# Patient Record
Sex: Male | Born: 1960 | Race: Black or African American | Hispanic: No | Marital: Married | Smoking: Never smoker
Health system: Southern US, Community
[De-identification: ages and names within clinical notes are randomized; demographics above are authoritative.]

---

## 2016-01-08 ENCOUNTER — Emergency Department (HOSPITAL_BASED_OUTPATIENT_CLINIC_OR_DEPARTMENT_OTHER)
Admission: EM | Admit: 2016-01-08 | Discharge: 2016-01-08 | Disposition: A | Discharge status: Home / Self Care | Payer: BLUE CROSS/BLUE SHIELD | Attending: Emergency Medicine | Admitting: Emergency Medicine

## 2016-01-08 ENCOUNTER — Encounter (HOSPITAL_BASED_OUTPATIENT_CLINIC_OR_DEPARTMENT_OTHER): Payer: Self-pay | Admitting: *Deleted

## 2016-01-08 DIAGNOSIS — R1013 Epigastric pain: Secondary | ICD-10-CM | POA: Insufficient documentation

## 2016-01-08 LAB — CBC WITH DIFFERENTIAL/PLATELET
BASOS ABS: 0 10*3/uL (ref 0.0–0.1)
Basophils Relative: 0 %
Eosinophils Absolute: 0.1 10*3/uL (ref 0.0–0.7)
Eosinophils Relative: 1 %
HEMATOCRIT: 44.4 % (ref 39.0–52.0)
HEMOGLOBIN: 15.2 g/dL (ref 13.0–17.0)
LYMPHS PCT: 33 %
Lymphs Abs: 2 10*3/uL (ref 0.7–4.0)
MCH: 32.4 pg (ref 26.0–34.0)
MCHC: 34.2 g/dL (ref 30.0–36.0)
MCV: 94.7 fL (ref 78.0–100.0)
Monocytes Absolute: 0.4 10*3/uL (ref 0.1–1.0)
Monocytes Relative: 7 %
NEUTROS ABS: 3.5 10*3/uL (ref 1.7–7.7)
NEUTROS PCT: 59 %
Platelets: 149 10*3/uL — ABNORMAL LOW (ref 150–400)
RBC: 4.69 MIL/uL (ref 4.22–5.81)
RDW: 13.9 % (ref 11.5–15.5)
WBC: 6 10*3/uL (ref 4.0–10.5)

## 2016-01-08 LAB — COMPREHENSIVE METABOLIC PANEL
ALBUMIN: 4 g/dL (ref 3.5–5.0)
ALT: 17 U/L (ref 17–63)
ANION GAP: 9 (ref 5–15)
AST: 21 U/L (ref 15–41)
Alkaline Phosphatase: 76 U/L (ref 38–126)
BILIRUBIN TOTAL: 0.5 mg/dL (ref 0.3–1.2)
BUN: 17 mg/dL (ref 6–20)
CO2: 27 mmol/L (ref 22–32)
Calcium: 8.8 mg/dL — ABNORMAL LOW (ref 8.9–10.3)
Chloride: 103 mmol/L (ref 101–111)
Creatinine, Ser: 1.29 mg/dL — ABNORMAL HIGH (ref 0.61–1.24)
GFR calc Af Amer: >60 mL/min (ref >60)
GFR calc non Af Amer: >60 mL/min (ref >60)
GLUCOSE: 141 mg/dL — AB (ref 65–99)
POTASSIUM: 3.8 mmol/L (ref 3.5–5.1)
Sodium: 139 mmol/L (ref 135–145)
TOTAL PROTEIN: 7.4 g/dL (ref 6.5–8.1)

## 2016-01-08 LAB — LIPASE, BLOOD: Lipase: 19 U/L (ref 11–51)

## 2016-01-08 MED ORDER — SUCRALFATE 1 G PO TABS: 1.0000 g | ORAL_TABLET | Freq: Three times a day (TID) | ORAL | 0 refills | Status: AC

## 2016-01-08 MED ORDER — OMEPRAZOLE 40 MG PO CPDR: DELAYED_RELEASE_CAPSULE | ORAL | 0 refills | Status: AC

## 2016-01-08 MED ORDER — ONDANSETRON HCL 4 MG/2ML IJ SOLN
4.0000 mg | Freq: Once | INTRAMUSCULAR | Status: AC
  Administered 2016-01-08: 4 mg via INTRAVENOUS
  Filled 2016-01-08: qty 2

## 2016-01-08 MED ORDER — PANTOPRAZOLE SODIUM 40 MG IV SOLR
40.0000 mg | Freq: Once | INTRAVENOUS | Status: AC
  Administered 2016-01-08: 40 mg via INTRAVENOUS
  Filled 2016-01-08: qty 40

## 2016-01-08 MED ORDER — SUCRALFATE 1 G PO TABS
1.0000 g | ORAL_TABLET | Freq: Once | ORAL | Status: AC
  Administered 2016-01-08: 1 g via ORAL
  Filled 2016-01-08: qty 1

## 2016-01-08 MED ORDER — SODIUM CHLORIDE 0.9 % IV SOLN
Freq: Once | INTRAVENOUS | Status: AC
  Administered 2016-01-08: 06:00:00 via INTRAVENOUS

## 2016-01-08 MED ORDER — ONDANSETRON 8 MG PO TBDP: 8.0000 mg | ORAL_TABLET | Freq: Three times a day (TID) | ORAL | 0 refills | Status: AC | PRN

## 2016-01-08 NOTE — ED Provider Notes (Signed)
[facility] Provider Note: Tony DellJ. Lane Kerrigan Gombos, MD, FACEP  CSN: [id number] MRN: [id number] ARRIVAL: 01/08/16 at 0515 ROOM: [facility]   CHIEF COMPLAINT  Abdominal Pain   HISTORY OF PRESENT ILLNESS  Tony Herrera is a 55 y.o. male with epigastric pain since yesterday. He states the pain feels like a big gas bubble. He rates as an 8 out of 10. The pain is worse with movement or palpation. He has had similar pains in the past which usually resolve with simethicone. He took simethicone without relief on this occasion. He has a subjective sense of his abdomen being bloated. He denies fever, chills, nausea, vomiting or diarrhea. He has not moved his bowels in about 3 days which is not unusual for him.   History reviewed. No pertinent past medical history.  History reviewed. No pertinent surgical history.  History reviewed. No pertinent family history.  Social History  Substance Use Topics  . Smoking status: Never Smoker  . Smokeless tobacco: Never Used  . Alcohol use No    Prior to Admission medications   Medication Sig Start Date End Date Taking? Authorizing Provider  omeprazole (PRILOSEC) 40 MG capsule Take one capsule daily at least 30 minutes before first dose of Carafate. 01/08/16   Deysy Schabel, MD  ondansetron (ZOFRAN ODT) 8 MG disintegrating tablet Take 1 tablet (8 mg total) by mouth every 8 (eight) hours as needed for nausea or vomiting. 01/08/16   Jimel Myler, MD  sucralfate (CARAFATE) 1 g tablet Take 1 tablet (1 g total) by mouth 4 (four) times daily -  with meals and at bedtime. 01/08/16   Paula LibraJohn Tayra Dawe, MD    Allergies Patient has no allergy information on record.   REVIEW OF SYSTEMS  Negative except as noted here or in the History of Present Illness.   PHYSICAL EXAMINATION  Initial Vital Signs Blood pressure 135/87, pulse 74, temperature 97.8 F (36.6 C), temperature source Oral, resp. rate 20, height 5\' 7"  (1.702 m), weight 245 lb (111.1 kg), SpO2 98  %.  Examination General: Well-developed, well-nourished male in no acute distress; appearance consistent with age of record HENT: normocephalic; atraumatic Eyes: pupils equal, round and reactive to light; extraocular muscles intact Neck: supple Heart: regular rate and rhythm Lungs: clear to auscultation bilaterally Abdomen: soft; nondistended; epigastric tenderness; negative Murphy's sign; no masses or hepatosplenomegaly; bowel sounds present Extremities: No deformity; full range of motion; pulses normal Neurologic: Awake, alert and oriented; motor function intact in all extremities and symmetric; no facial droop Skin: Warm and dry Psychiatric: Normal mood and affect   RESULTS  Summary of this visit's results, reviewed by myself:   EKG Interpretation  Date/Time:    Ventricular Rate:    PR Interval:    QRS Duration:   QT Interval:    QTC Calculation:   R Axis:     Text Interpretation:        Laboratory Studies: Results for orders placed or performed during the hospital encounter of 01/08/16 (from the past 24 hour(s))  Lipase, blood     Status: None   Collection Time: 01/08/16  5:44 AM  Result Value Ref Range   Lipase 19 11 - 51 U/L  Comprehensive metabolic panel     Status: Abnormal   Collection Time: 01/08/16  5:44 AM  Result Value Ref Range   Sodium 139 135 - 145 mmol/L   Potassium 3.8 3.5 - 5.1 mmol/L   Chloride 103 101 - 111 mmol/L   CO2 27 22 -  32 mmol/L   Glucose, Bld 141 (H) 65 - 99 mg/dL   BUN 17 6 - 20 mg/dL   Creatinine, Ser 9.601.29 (H) 0.61 - 1.24 mg/dL   Calcium 8.8 (L) 8.9 - 10.3 mg/dL   Total Protein 7.4 6.5 - 8.1 g/dL   Albumin 4.0 3.5 - 5.0 g/dL   AST 21 15 - 41 U/L   ALT 17 17 - 63 U/L   Alkaline Phosphatase 76 38 - 126 U/L   Total Bilirubin 0.5 0.3 - 1.2 mg/dL   GFR calc non Af Amer >60 >60 mL/min   GFR calc Af Amer >60 >60 mL/min   Anion gap 9 5 - 15  CBC with Differential/Platelet     Status: Abnormal   Collection Time: 01/08/16  5:44 AM    Result Value Ref Range   WBC 6.0 4.0 - 10.5 K/uL   RBC 4.69 4.22 - 5.81 MIL/uL   Hemoglobin 15.2 13.0 - 17.0 g/dL   HCT 45.444.4 09.839.0 - 11.952.0 %   MCV 94.7 78.0 - 100.0 fL   MCH 32.4 26.0 - 34.0 pg   MCHC 34.2 30.0 - 36.0 g/dL   RDW 14.713.9 82.911.5 - 56.215.5 %   Platelets 149 (L) 150 - 400 K/uL   Neutrophils Relative % 59 %   Neutro Abs 3.5 1.7 - 7.7 K/uL   Lymphocytes Relative 33 %   Lymphs Abs 2.0 0.7 - 4.0 K/uL   Monocytes Relative 7 %   Monocytes Absolute 0.4 0.1 - 1.0 K/uL   Eosinophils Relative 1 %   Eosinophils Absolute 0.1 0.0 - 0.7 K/uL   Basophils Relative 0 %   Basophils Absolute 0.0 0.0 - 0.1 K/uL   Imaging Studies: No results found.  ED COURSE  Nursing notes and initial vitals signs, including pulse oximetry, reviewed.  Vitals:   01/08/16 0527 01/08/16 0528  BP: 135/87   Pulse: 74   Resp: 20   Temp: 97.8 F (36.6 C)   TempSrc: Oral   SpO2: 98%   Weight:  245 lb (111.1 kg)  Height:  5\' 7"  (1.702 m)   6:41 AM Significant relief with IV tonics and oral Carafate. We'll treat for gastritis.  PROCEDURES    ED DIAGNOSES     ICD-9-CM ICD-10-CM   1. Epigastric pain 789.06 R10.13        Paula LibraJohn Marjan Rosman, MD 01/08/16 [phone]

## 2016-01-08 NOTE — ED Triage Notes (Signed)
Pt with upper abd pain and nausea x 12 hours denies V/D

## 2016-01-21 ENCOUNTER — Other Ambulatory Visit: Payer: Self-pay | Admitting: Gastroenterology

## 2016-01-21 DIAGNOSIS — K2970 Gastritis, unspecified, without bleeding: Secondary | ICD-10-CM

## 2016-01-21 DIAGNOSIS — R1013 Epigastric pain: Secondary | ICD-10-CM

## 2016-01-28 ENCOUNTER — Other Ambulatory Visit: Payer: BLUE CROSS/BLUE SHIELD

## 2016-02-04 ENCOUNTER — Ambulatory Visit
Admission: RE | Admit: 2016-02-04 | Discharge: 2016-02-04 | Disposition: A | Discharge status: Home / Self Care | Payer: BLUE CROSS/BLUE SHIELD | Source: Ambulatory Visit | Attending: Gastroenterology | Admitting: Gastroenterology

## 2016-02-04 DIAGNOSIS — K2970 Gastritis, unspecified, without bleeding: Secondary | ICD-10-CM

## 2016-02-04 DIAGNOSIS — R1013 Epigastric pain: Secondary | ICD-10-CM

## 2017-08-16 IMAGING — RF DG UGI W/ HIGH DENSITY W/KUB
6 series · 15 of 22 positions shown · non-contrast
Comparison: None.

CLINICAL DATA: Epigastric pain, gastritis

EXAM:
UPPER GI SERIES WITH KUB
TECHNIQUE: After obtaining a scout radiograph a routine upper GI series was
performed using thin and high density barium.
FLUOROSCOPY TIME:  Fluoroscopy Time:  1 minutes 36 seconds
Radiation Exposure Index (if provided by the fluoroscopic device):
585 mGy
Number of Acquired Spot Images: 9

[Series 1: one shot · 1 of 1 slices shown (1 of 2)]
[im 1/1]
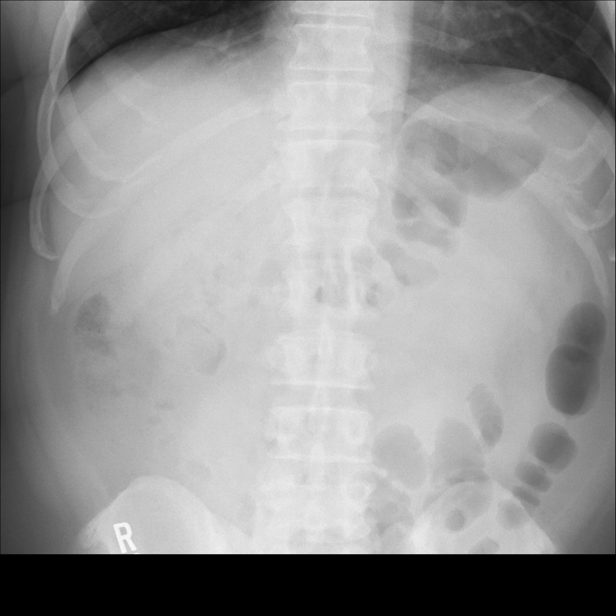

[Series 2: sequence · 2 of 23 frames shown (1 of 4)]
[frame 12/23]
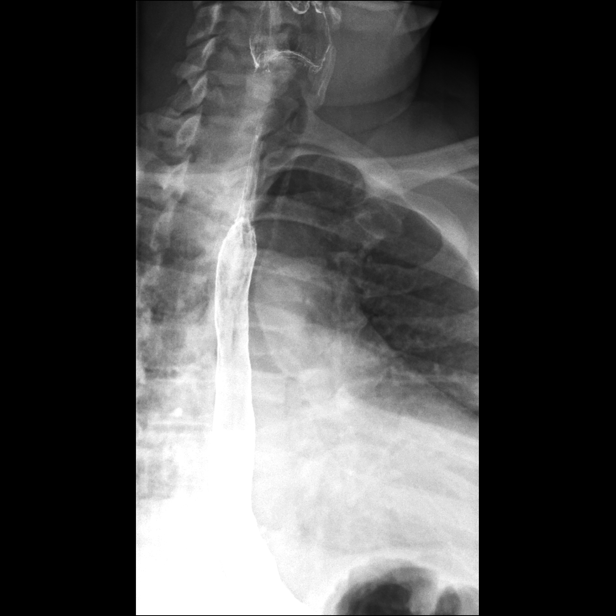
[frame 15/23]
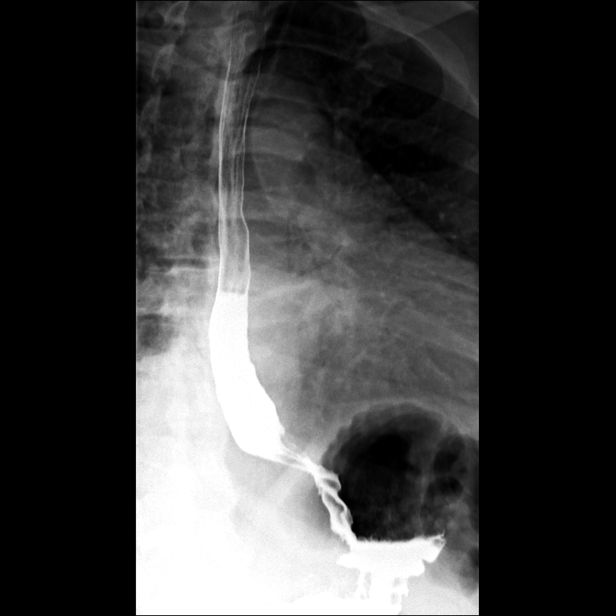

[Series 3: sequence · 3 of 15 frames shown (2 of 4)]
[frame 3/15]
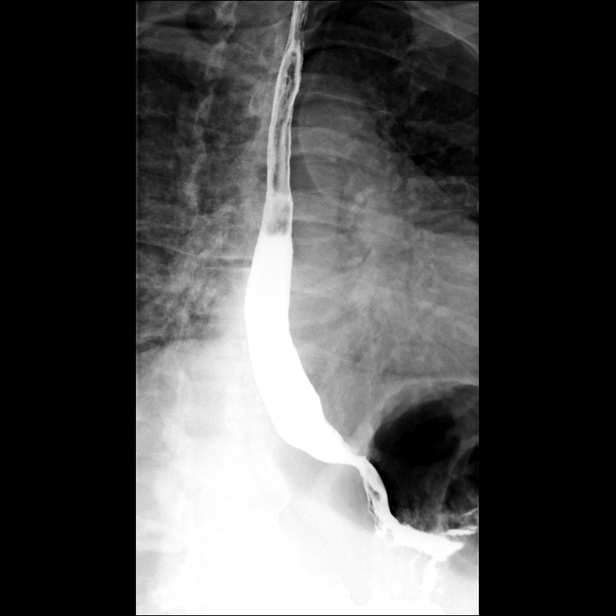
[frame 7/15]
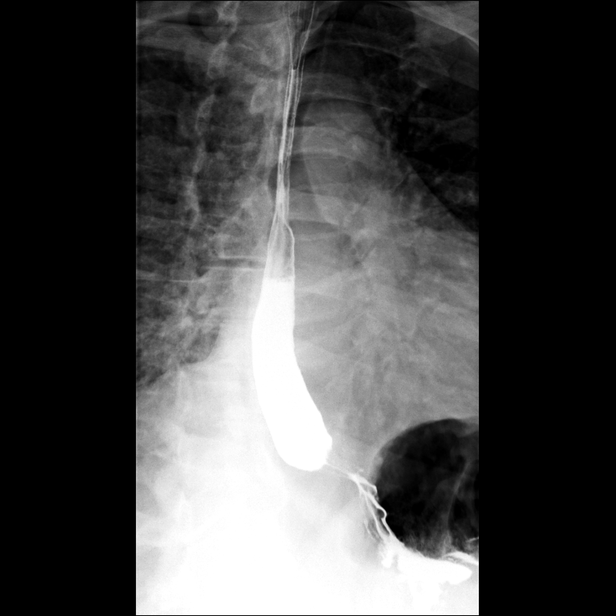
[frame 13/15]
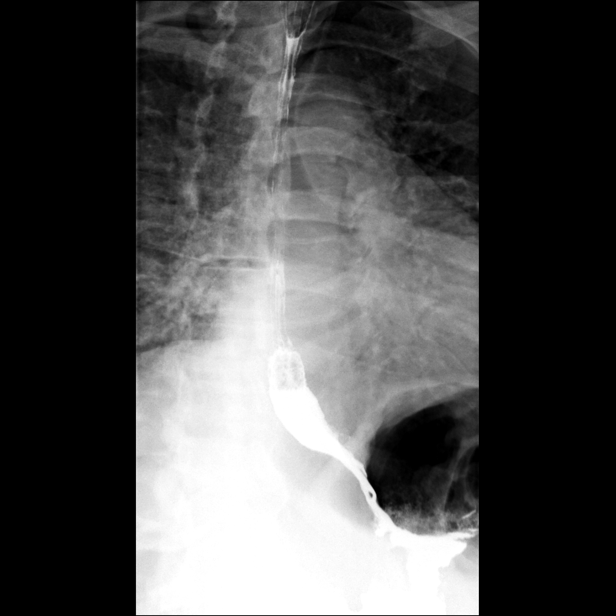

[Series 4: one shot · 4 of 5 slices shown (2 of 2)]
[im 1/5]
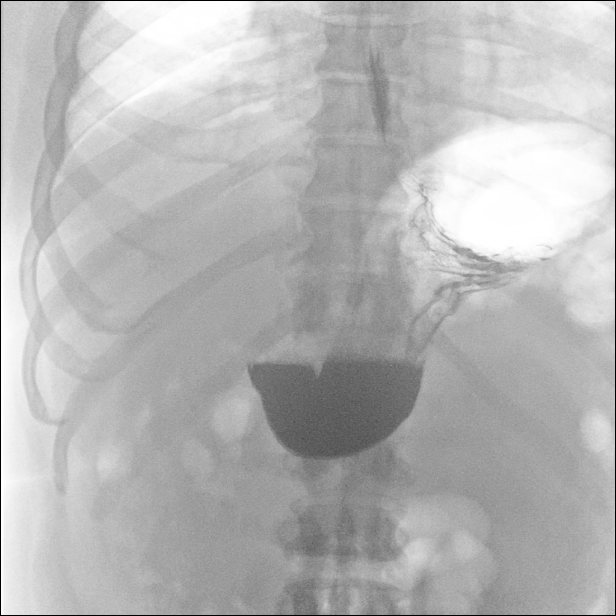
[im 3/5]
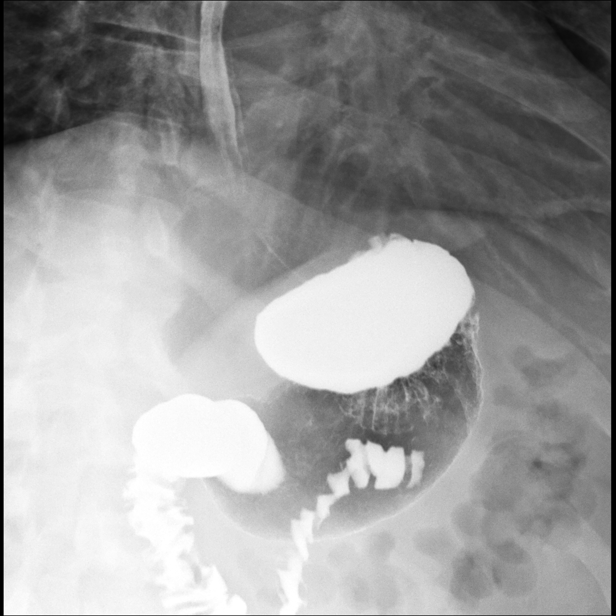
[im 4/5]
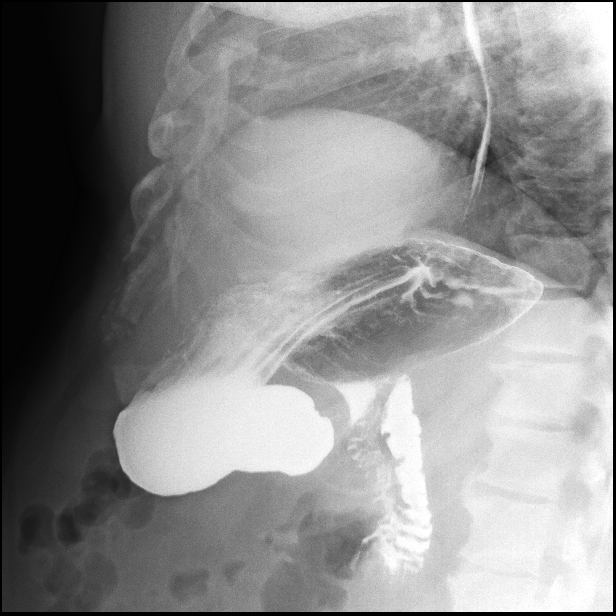
[im 5/5]
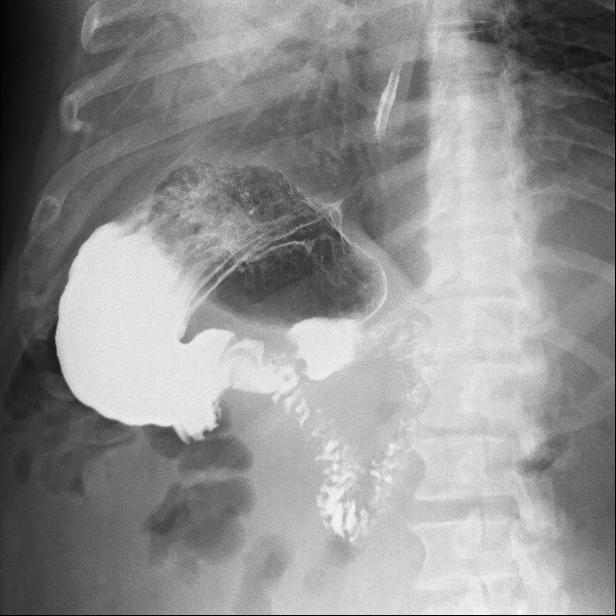

[Series 5: sequence · 2 of 19 frames shown (3 of 4)]
[frame 8/19]
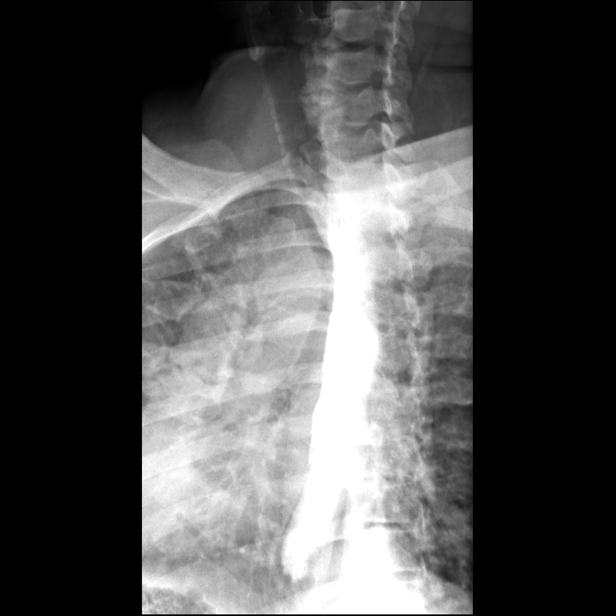
[frame 10/19]
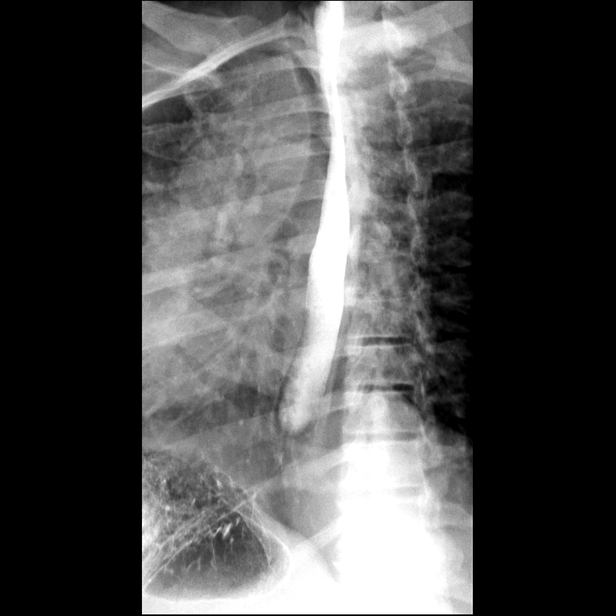

[Series 6: sequence · 3 of 37 frames shown (4 of 4)]
[frame 1/37]
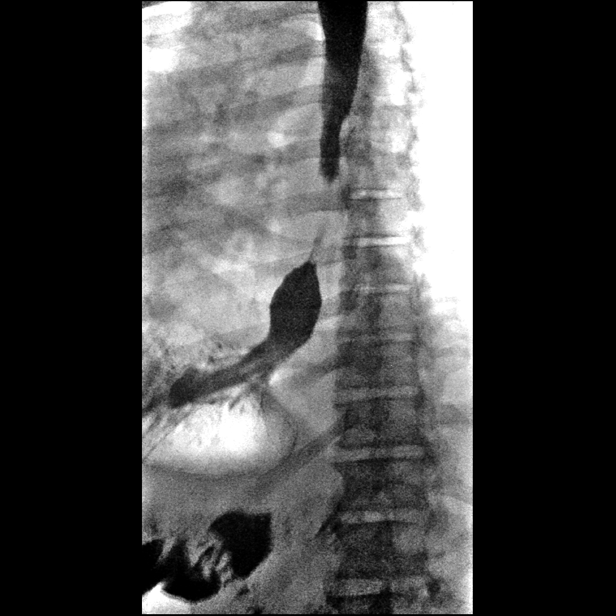
[frame 6/37]
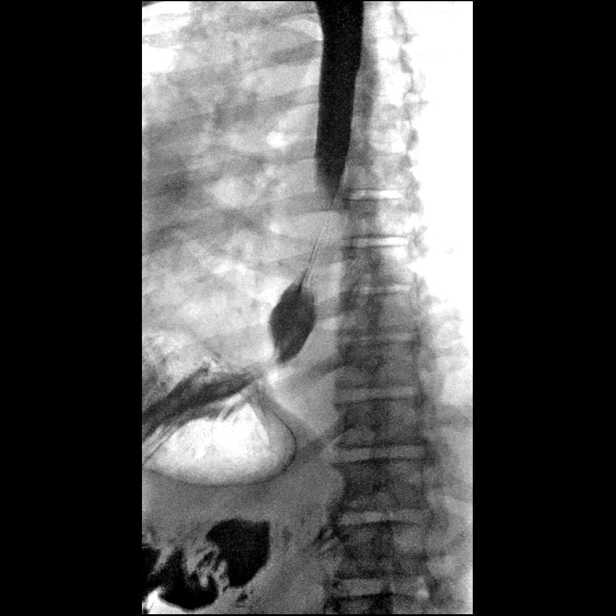
[frame 32/37]
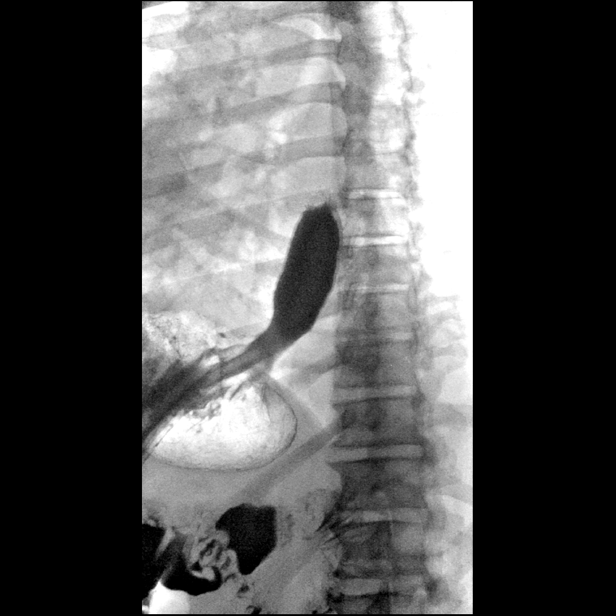

[15 of 22 positions shown; findings below may reference images not displayed]

FINDINGS: Normal esophageal peristalsis.

No fixed esophageal narrowing or stricture.

No evidence of hiatal hernia. No gastroesophageal reflux was
demonstrated.

Normal gastric folds.

Visualized small bowel is within normal limits.
IMPRESSION: Normal double contrast upper GI.
